# Patient Record
Sex: Female | Born: 1985 | Hispanic: Yes | Marital: Single | Smoking: Never smoker
Health system: Southern US, Community
[De-identification: ages and names within clinical notes are randomized; demographics above are authoritative.]

---

## 2013-12-22 ENCOUNTER — Emergency Department: Payer: Self-pay | Admitting: Emergency Medicine

## 2013-12-22 LAB — URINALYSIS, COMPLETE
BLOOD: NEGATIVE
Bilirubin,UR: NEGATIVE
GLUCOSE, UR: NEGATIVE mg/dL (ref 0–75)
Leukocyte Esterase: NEGATIVE
NITRITE: POSITIVE
Ph: 5 (ref 4.5–8.0)
Protein: 30
Specific Gravity: 1.028 (ref 1.003–1.030)
WBC UR: 8 /HPF (ref 0–5)

## 2013-12-22 LAB — CBC WITH DIFFERENTIAL/PLATELET
Basophil #: 0.1 10*3/uL (ref 0.0–0.1)
Basophil %: 1.2 %
Eosinophil #: 0.1 10*3/uL (ref 0.0–0.7)
Eosinophil %: 0.8 %
HCT: 33.7 % — ABNORMAL LOW (ref 35.0–47.0)
HGB: 10.5 g/dL — AB (ref 12.0–16.0)
Lymphocyte #: 2.7 10*3/uL (ref 1.0–3.6)
Lymphocyte %: 27.6 %
MCH: 20.9 pg — ABNORMAL LOW (ref 26.0–34.0)
MCHC: 31 g/dL — ABNORMAL LOW (ref 32.0–36.0)
MCV: 67 fL — ABNORMAL LOW (ref 80–100)
MONO ABS: 0.5 x10 3/mm (ref 0.2–0.9)
MONOS PCT: 5.5 %
Neutrophil #: 6.4 10*3/uL (ref 1.4–6.5)
Neutrophil %: 64.9 %
Platelet: 267 10*3/uL (ref 150–440)
RBC: 5.01 10*6/uL (ref 3.80–5.20)
RDW: 18.2 % — AB (ref 11.5–14.5)
WBC: 9.8 10*3/uL (ref 3.6–11.0)

## 2013-12-22 LAB — COMPREHENSIVE METABOLIC PANEL
ALBUMIN: 4 g/dL (ref 3.4–5.0)
ALT: 32 U/L (ref 12–78)
ANION GAP: 8 (ref 7–16)
AST: 33 U/L (ref 15–37)
Alkaline Phosphatase: 116 U/L
BUN: 7 mg/dL (ref 7–18)
Bilirubin,Total: 0.6 mg/dL (ref 0.2–1.0)
CALCIUM: 9 mg/dL (ref 8.5–10.1)
Chloride: 105 mmol/L (ref 98–107)
Co2: 21 mmol/L (ref 21–32)
Creatinine: 0.56 mg/dL — ABNORMAL LOW (ref 0.60–1.30)
EGFR (African American): >60
GLUCOSE: 58 mg/dL — AB (ref 65–99)
OSMOLALITY: 264 (ref 275–301)
POTASSIUM: 3.4 mmol/L — AB (ref 3.5–5.1)
Sodium: 134 mmol/L — ABNORMAL LOW (ref 136–145)
Total Protein: 9.3 g/dL — ABNORMAL HIGH (ref 6.4–8.2)

## 2013-12-22 LAB — LIPASE, BLOOD: LIPASE: 209 U/L (ref 73–393)

## 2013-12-23 LAB — HCG, QUANTITATIVE, PREGNANCY: BETA HCG, QUANT.: 146062 m[IU]/mL — AB

## 2013-12-29 ENCOUNTER — Emergency Department: Payer: Self-pay | Admitting: Emergency Medicine

## 2013-12-29 LAB — COMPREHENSIVE METABOLIC PANEL
Albumin: 3.6 g/dL (ref 3.4–5.0)
Alkaline Phosphatase: 109 U/L
Anion Gap: 10 (ref 7–16)
BUN: 7 mg/dL (ref 7–18)
Bilirubin,Total: 0.5 mg/dL (ref 0.2–1.0)
CHLORIDE: 103 mmol/L (ref 98–107)
CO2: 21 mmol/L (ref 21–32)
CREATININE: 0.79 mg/dL (ref 0.60–1.30)
Calcium, Total: 9.1 mg/dL (ref 8.5–10.1)
EGFR (African American): >60
GLUCOSE: 119 mg/dL — AB (ref 65–99)
Osmolality: 267 (ref 275–301)
Potassium: 3.2 mmol/L — ABNORMAL LOW (ref 3.5–5.1)
SGOT(AST): 36 U/L (ref 15–37)
SGPT (ALT): 56 U/L (ref 12–78)
SODIUM: 134 mmol/L — AB (ref 136–145)
Total Protein: 8.4 g/dL — ABNORMAL HIGH (ref 6.4–8.2)

## 2013-12-29 LAB — CBC
HCT: 36.5 % (ref 35.0–47.0)
HGB: 11.4 g/dL — ABNORMAL LOW (ref 12.0–16.0)
MCH: 21.1 pg — ABNORMAL LOW (ref 26.0–34.0)
MCHC: 31.2 g/dL — AB (ref 32.0–36.0)
MCV: 68 fL — ABNORMAL LOW (ref 80–100)
Platelet: 252 10*3/uL (ref 150–440)
RBC: 5.39 10*6/uL — ABNORMAL HIGH (ref 3.80–5.20)
RDW: 19.1 % — AB (ref 11.5–14.5)
WBC: 8.5 10*3/uL (ref 3.6–11.0)

## 2013-12-29 LAB — HCG, QUANTITATIVE, PREGNANCY: BETA HCG, QUANT.: 46292 m[IU]/mL — AB

## 2013-12-30 LAB — URINALYSIS, COMPLETE
Blood: NEGATIVE
Glucose,UR: 50 mg/dL (ref 0–75)
Nitrite: NEGATIVE
PH: 5 (ref 4.5–8.0)
Protein: 100
RBC,UR: 6 /HPF (ref 0–5)
Specific Gravity: 1.027 (ref 1.003–1.030)
Squamous Epithelial: 10

## 2020-02-26 ENCOUNTER — Ambulatory Visit: Payer: Self-pay | Admitting: Internal Medicine

## 2021-02-20 ENCOUNTER — Emergency Department: Payer: Self-pay

## 2021-02-20 ENCOUNTER — Emergency Department
Admission: EM | Admit: 2021-02-20 | Discharge: 2021-02-20 | Disposition: A | Discharge status: Home / Self Care | Payer: Self-pay | Attending: Emergency Medicine | Admitting: Emergency Medicine

## 2021-02-20 ENCOUNTER — Other Ambulatory Visit: Payer: Self-pay

## 2021-02-20 DIAGNOSIS — R109 Unspecified abdominal pain: Secondary | ICD-10-CM

## 2021-02-20 DIAGNOSIS — K8020 Calculus of gallbladder without cholecystitis without obstruction: Secondary | ICD-10-CM | POA: Insufficient documentation

## 2021-02-20 DIAGNOSIS — D509 Iron deficiency anemia, unspecified: Secondary | ICD-10-CM

## 2021-02-20 DIAGNOSIS — K529 Noninfective gastroenteritis and colitis, unspecified: Secondary | ICD-10-CM | POA: Insufficient documentation

## 2021-02-20 DIAGNOSIS — D649 Anemia, unspecified: Secondary | ICD-10-CM | POA: Insufficient documentation

## 2021-02-20 DIAGNOSIS — N390 Urinary tract infection, site not specified: Secondary | ICD-10-CM | POA: Insufficient documentation

## 2021-02-20 LAB — POC URINE PREG, ED: Preg Test, Ur: NEGATIVE

## 2021-02-20 LAB — COMPREHENSIVE METABOLIC PANEL
ALT: 39 U/L (ref 0–44)
AST: 29 U/L (ref 15–41)
Albumin: 4 g/dL (ref 3.5–5.0)
Alkaline Phosphatase: 71 U/L (ref 38–126)
Anion gap: 8 (ref 5–15)
BUN: 10 mg/dL (ref 6–20)
CO2: 23 mmol/L (ref 22–32)
Calcium: 8.7 mg/dL — ABNORMAL LOW (ref 8.9–10.3)
Chloride: 107 mmol/L (ref 98–111)
Creatinine, Ser: 0.57 mg/dL (ref 0.44–1.00)
GFR, Estimated: >60 mL/min (ref >60)
Glucose, Bld: 89 mg/dL (ref 70–99)
Potassium: 3.4 mmol/L — ABNORMAL LOW (ref 3.5–5.1)
Sodium: 138 mmol/L (ref 135–145)
Total Bilirubin: 0.9 mg/dL (ref 0.3–1.2)
Total Protein: 8.2 g/dL — ABNORMAL HIGH (ref 6.5–8.1)

## 2021-02-20 LAB — URINALYSIS, COMPLETE (UACMP) WITH MICROSCOPIC
Bilirubin Urine: NEGATIVE
Glucose, UA: NEGATIVE mg/dL
Hgb urine dipstick: NEGATIVE
Ketones, ur: 5 mg/dL — AB
Leukocytes,Ua: NEGATIVE
Nitrite: POSITIVE — AB
Protein, ur: NEGATIVE mg/dL
Specific Gravity, Urine: 1.026 (ref 1.005–1.030)
pH: 5 (ref 5.0–8.0)

## 2021-02-20 LAB — CBC
HCT: 28.9 % — ABNORMAL LOW (ref 36.0–46.0)
Hemoglobin: 7.8 g/dL — ABNORMAL LOW (ref 12.0–15.0)
MCH: 16.8 pg — ABNORMAL LOW (ref 26.0–34.0)
MCHC: 27 g/dL — ABNORMAL LOW (ref 30.0–36.0)
MCV: 62.2 fL — ABNORMAL LOW (ref 80.0–100.0)
Platelets: 222 10*3/uL (ref 150–400)
RBC: 4.65 MIL/uL (ref 3.87–5.11)
RDW: 19.9 % — ABNORMAL HIGH (ref 11.5–15.5)
WBC: 5.1 10*3/uL (ref 4.0–10.5)
nRBC: 0 % (ref 0.0–0.2)

## 2021-02-20 LAB — IRON AND TIBC
Iron: 12 ug/dL — ABNORMAL LOW (ref 28–170)
Saturation Ratios: 3 % — ABNORMAL LOW (ref 10.4–31.8)
TIBC: 489 ug/dL — ABNORMAL HIGH (ref 250–450)
UIBC: 477 ug/dL

## 2021-02-20 LAB — LIPASE, BLOOD: Lipase: 26 U/L (ref 11–51)

## 2021-02-20 LAB — HCG, QUANTITATIVE, PREGNANCY: hCG, Beta Chain, Quant, S: <1 m[IU]/mL (ref <5)

## 2021-02-20 MED ORDER — ONDANSETRON 4 MG PO TBDP: 4.0000 mg | ORAL_TABLET | Freq: Four times a day (QID) | ORAL | 0 refills | Status: AC | PRN

## 2021-02-20 MED ORDER — FERROUS SULFATE 325 (65 FE) MG PO TABS: 325.0000 mg | ORAL_TABLET | Freq: Every day | ORAL | 3 refills | Status: AC

## 2021-02-20 MED ORDER — AMOXICILLIN-POT CLAVULANATE 875-125 MG PO TABS: 1.0000 | ORAL_TABLET | Freq: Two times a day (BID) | ORAL | 0 refills | Status: AC

## 2021-02-20 NOTE — ED Provider Notes (Signed)
[facility] Emergency Department Provider Note   ____________________________________________   Event Date/Time   First MD Initiated Contact with Patient 02/20/21 1513     (approximate)  I have reviewed the triage vital signs and the nursing notes.   HISTORY  Chief Complaint Abdominal Pain, Nausea, and Diarrhea  Spanish interpreter utilized Shelby Gamble)  HPI Shelby Gamble is a 35 y.o. female with a history of prior cesarean section about 6 years ago as well as anemia  Patient reports she has had 2 days of pain moderate in her upper abdomen.  No chest pain or shortness of breath.  Denies pregnancy.  The pain is located right in the middle of her upper abdomen.  Does not radiate.  No fevers or chills.  No nausea or vomiting.  She has had some loose stools that are not black or bloody   Patient reports she used to be on iron tablets but moved here from [place] and since then has not been on them.  She is aware that she has anemia has not been able to be on iron.  She denies any bloody stools bleeding or other issue.  Reports previous cesarean section about 6 years ago   History reviewed. No pertinent past medical history.  There are no problems to display for this patient.   History reviewed. No pertinent surgical history.  Prior to Admission medications   Medication Sig Start Date End Date Taking? Authorizing Provider  amoxicillin-clavulanate (AUGMENTIN) 875-125 MG tablet Take 1 tablet by mouth 2 (two) times daily. 02/20/21  Yes Sharyn Creamer, MD  ondansetron (ZOFRAN ODT) 4 MG disintegrating tablet Take 1 tablet (4 mg total) by mouth every 6 (six) hours as needed for nausea or vomiting. 02/20/21  Yes Sharyn Creamer, MD  ferrous sulfate 325 (65 FE) MG tablet Take 1 tablet (325 mg total) by mouth daily. 02/20/21 06/20/21 Yes Sharyn Creamer, MD    Allergies Patient has no allergy information on record.  History reviewed. No pertinent family history.  Social  History Social History   Tobacco Use  . Smoking status: Never Smoker  . Smokeless tobacco: Never Used  Substance Use Topics  . Alcohol use: Not Currently  . Drug use: Not Currently    Review of Systems Constitutional: No fever/chills Eyes: No visual changes. Cardiovascular: Denies chest pain. Respiratory: Denies shortness of breath. Gastrointestinal: See HPI.  Denies lower abdominal pain. Genitourinary: Negative for dysuria.  No vaginal discharge or bleeding. Musculoskeletal: Negative for back pain. Skin: Negative for rash. Neurological: Negative for headaches, areas of focal weakness or numbness.    ____________________________________________   PHYSICAL EXAM:  VITAL SIGNS: ED Triage Vitals  Enc Vitals Group     BP 02/20/21 1523 131/87     Pulse Rate 02/20/21 1523 82     Resp 02/20/21 1523 16     Temp 02/20/21 1523 98.7 F (37.1 C)     Temp Source 02/20/21 1523 Oral     SpO2 02/20/21 1523 100 %     Weight 02/20/21 1523 134 lb 14.7 oz (61.2 kg)     Height 02/20/21 1457 5\' 2"  (1.575 m)     Head Circumference --      Peak Flow --      Pain Score 02/20/21 1457 4     Pain Loc --      Pain Edu? --      Excl. in GC? --     Constitutional: Alert and oriented. Well appearing and in no  acute distress.  Sitting up without distress. Eyes: Conjunctivae are normal. Head: Atraumatic. Nose: No congestion/rhinnorhea. Mouth/Throat: Mucous membranes are moist. Neck: No stridor.  Cardiovascular: Normal rate, regular rhythm. Grossly normal heart sounds.  Good peripheral circulation. Respiratory: Normal respiratory effort.  No retractions. Lungs CTAB. Gastrointestinal: Soft and reports moderate tenderness focally in the epigastrium and also slightly in the right upper quadrant as well.  Eulah Pont sign is equivocal.  She does also report very mild tenderness to the lower quadrants bilaterally, but reports the majority of her pain is really in the mid upper abdomen located to the  epigastrium.  No rebound guarding or distention. No distention. Musculoskeletal: No lower extremity tenderness nor edema. Neurologic:  Normal speech and language. No gross focal neurologic deficits are appreciated.  Skin:  Skin is warm, dry and intact. No rash noted. Psychiatric: Mood and affect are normal. Speech and behavior are normal.  ____________________________________________   LABS (all labs ordered are listed, but only abnormal results are displayed)  Labs Reviewed  COMPREHENSIVE METABOLIC PANEL - Abnormal; Notable for the following components:      Result Value   Potassium 3.4 (*)    Calcium 8.7 (*)    Total Protein 8.2 (*)    All other components within normal limits  CBC - Abnormal; Notable for the following components:   Hemoglobin 7.8 (*)    HCT 28.9 (*)    MCV 62.2 (*)    MCH 16.8 (*)    MCHC 27.0 (*)    RDW 19.9 (*)    All other components within normal limits  URINALYSIS, COMPLETE (UACMP) WITH MICROSCOPIC - Abnormal; Notable for the following components:   Color, Urine AMBER (*)    APPearance HAZY (*)    Ketones, ur 5 (*)    Nitrite POSITIVE (*)    Bacteria, UA MANY (*)    All other components within normal limits  IRON AND TIBC - Abnormal; Notable for the following components:   Iron 12 (*)    TIBC 489 (*)    Saturation Ratios 3 (*)    All other components within normal limits  LIPASE, BLOOD  HCG, QUANTITATIVE, PREGNANCY  POC URINE PREG, ED   ____________________________________________  EKG  Reviewed inter by me at 1330 Heart rate 80 QRS 80 QTc 430 Normal sinus rhythm, left ventricular hypertrophy.  No evidence of acute ischemia ____________________________________________  RADIOLOGY  CT ABDOMEN PELVIS WO CONTRAST  Result Date: 02/20/2021 CLINICAL DATA:  Epigastric pain EXAM: CT ABDOMEN AND PELVIS WITHOUT CONTRAST TECHNIQUE: Multidetector CT imaging of the abdomen and pelvis was performed following the standard protocol without IV contrast.  COMPARISON:  Ultrasound 02/20/2021, 12/30/2013 FINDINGS: Lower chest: Lung bases are clear. Normal heart size. No pericardial effusion. Hepatobiliary: Diffuse hepatic hypoattenuation compatible with hepatic steatosis, sparing gallbladder fossa. Gallbladder contains partially calcified gallstones. No pericholecystic fluid or inflammation. No significant gallbladder distension. No biliary ductal dilatation or visible intraductal gallstones. Pancreas: No pancreatic ductal dilatation or surrounding inflammatory changes. Spleen: Normal in size. No concerning splenic lesions. Adrenals/Urinary Tract: Normal adrenals. Kidneys are symmetric in size and normally located. No visible or contour deforming renal lesion is seen. No urolithiasis or hydronephrosis. Stomach/Bowel: Distal esophagus, stomach and duodenal sweep are unremarkable. Much of the large and small bowel appears fluid-filled but without significant bowel wall thickening or dilatation. No evidence of obstruction. Partially air-filled appendix without focal periappendiceal inflammation is seen in the right lower quadrant. Vascular/Lymphatic: No significant vascular findings are present. Numerous though nonenlarged nodes are  seen in the mesentery, possibly reactive. No enlarged abdominal or pelvic lymph nodes. Reproductive: Slightly retroflexed uterus without other gross abnormality. No concerning adnexal masses or lesions. Other: Small volume of low-attenuation free fluid in the deep pelvis, nonspecific in a reproductive age female. No free intraperitoneal air. No bowel containing hernias. Musculoskeletal: No acute osseous abnormality or suspicious osseous lesion. IMPRESSION: 1. Diffusely fluid-filled appearance of the small bowel and much of the colon with some possibly reactive nodes in the mesentery, nonspecific findings though could reflect a mild enterocolitis in the clinical setting. 2. Small volume low-attenuation free fluid in the deep pelvis, nonspecific  though often physiologic in a reproductive age female. 3. Cholelithiasis. No evidence of acute cholecystitis or biliary dilatation. 4. Hepatic steatosis Electronically Signed   By: Kreg ShropshirePrice  DeHay M.D.   On: 02/20/2021 18:05   US ABDOMEN LIMITED RUQ (LIVER/GB)  Result Date: 02/20/2021 CLINICAL DATA:  35 year old female with nausea vomiting abdominal pain. EXAM: ULTRASOUND ABDOMEN LIMITED RIGHT UPPER QUADRANT COMPARISON:  Right upper quadrant ultrasound dated 12/30/2013. FINDINGS: Gallbladder: There is gallstone and small amount of sludge within the gallbladder. No gallbladder wall thickening or pericholecystic fluid. Negative sonographic Murphy's sign. Common bile duct: Diameter: 3 mm Liver: There is diffuse increased liver echogenicity most commonly seen in the setting of fatty infiltration. Superimposed inflammation or fibrosis is not excluded. Clinical correlation is recommended. Portal vein is patent on color Doppler imaging with normal direction of blood flow towards the liver. Other: None. IMPRESSION: 1. Cholelithiasis without sonographic evidence of acute cholecystitis. 2. Fatty liver. Electronically Signed   By: Elgie CollardArash  Radparvar M.D.   On: 02/20/2021 16:43    Imaging reviewed ultrasound positive for cholelithiasis no evidence cholecystitis  CT imaging reviewed indicative of likely enterocolitis, cholelithiasis without evidence cholecystitis ____________________________________________   PROCEDURES  Procedure(s) performed: None  Procedures  Critical Care performed: No  ____________________________________________   INITIAL IMPRESSION / ASSESSMENT AND PLAN / ED COURSE  Pertinent labs & imaging results that were available during my care of the patient were reviewed by me and considered in my medical decision making (see chart for details).   Differential diagnosis includes but is not limited to, abdominal perforation, aortic dissection, cholecystitis, appendicitis, diverticulitis,  colitis, pud, esophagitis/gastritis, kidney stone, pyelonephritis, urinary tract infection, aortic aneurysm. All are considered in decision and treatment plan. Based upon the patient's presentation and risk factors, we will proceed first by obtaining ultrasound of the right upper quadrant epigastric region pancreatic region etc.  If this fails to show an acute cause will consider CT abdomen pelvis.  Discussed this with the patient she is in agreement  Patient is very nontoxic well-appearing afebrile without white count.  Reassuring exam without acute abdomen  Also of note, patient's labs indicate significant anemia, appears to be microcytic in nature.  Review of previous labs from years ago does indicate she had elements of anemia as well at that time, but not as severe.  She does not appear to have any symptomatic anemia.    ----------------------------------------- 7:47 PM on 02/20/2021 -----------------------------------------  Reviewed labs, imaging to this point.  There was some concern about potentially urinary tract infection, also possible mild colitis.  Placed the patient on Augmentin for treatment of UTI, Zofran, iron tablets which she is previously been on.  Discussed careful return precautions with her and her son.  She is understanding and in agreement.  Also made recommendations for follow-up she reports not having any insurance at this time and did give recommendations to follow-up  with [facility]  I did reinforce with her I think she will need some longstanding primary care and specialty follow-up on her anemia possibly gallstones  No evidence of acute cholecystitis or symptomatic gallstone at this point.  Will discharge, discussed careful return precautions around her current diagnoses as well as signs and symptoms to watch for should she ever have issues with her gallstone  Return precautions and treatment recommendations and follow-up discussed  with the patient who is agreeable with the plan.   ____________________________________________   FINAL CLINICAL IMPRESSION(S) / ED DIAGNOSES  Final diagnoses:  Abdominal pain  Iron deficiency anemia, unspecified iron deficiency anemia type  Urinary tract infection, acute  Colitis  Calculus of gallbladder without cholecystitis without obstruction        Note:  This document was prepared using Dragon voice recognition software and may include unintentional dictation errors       Sharyn Creamer, MD 02/20/21 1948

## 2021-02-20 NOTE — ED Notes (Signed)
Dr. Fanny Bien at bedside with interpreter

## 2021-02-20 NOTE — ED Triage Notes (Addendum)
Pt arrives to ed via pov with c/o abd pain and n/v/d x 2 days. Interpreter used during triage. Pt reports pain in epigastric region w/o radiation.  denies any chest pain. Denies any blood in stool or emesis.  NAD noted at this time.

## 2022-10-27 IMAGING — US US ABDOMEN LIMITED
1 series · 15 of 25 positions shown · non-contrast
Comparison: Right upper quadrant ultrasound dated 12/30/2013.

CLINICAL DATA: 33-year-old female with nausea vomiting abdominal
pain.

EXAM:
ULTRASOUND ABDOMEN LIMITED RIGHT UPPER QUADRANT

[Series 1: us abdomen limited ruq · 15 of 51 slices shown]
[im 1/51]
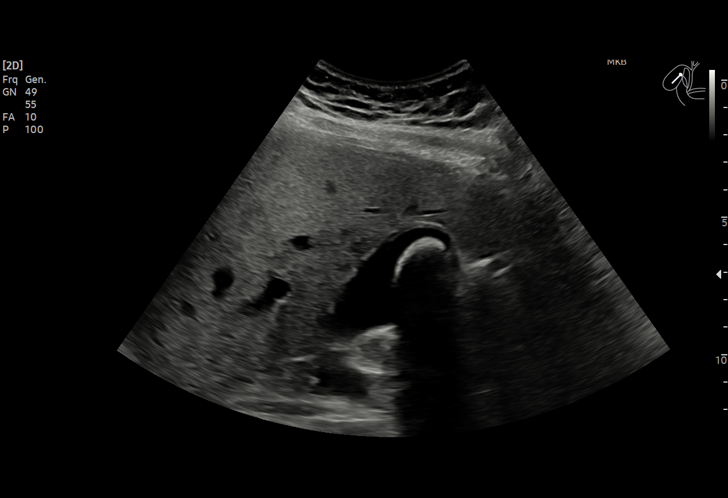
[im 5/51]
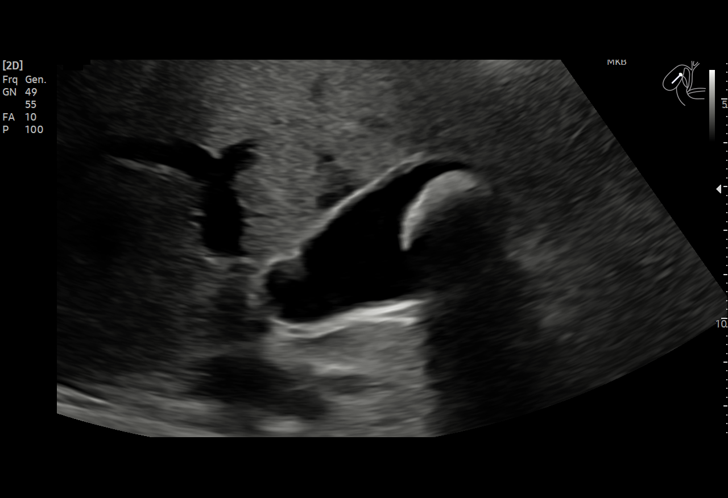
[im 9/51]
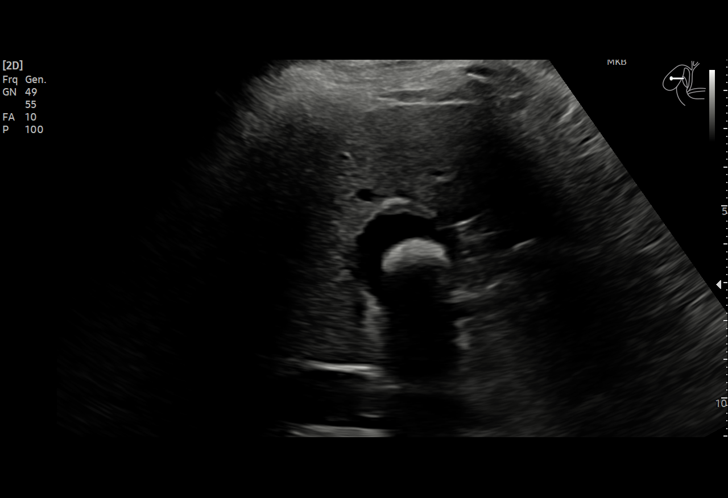
[im 11/51]
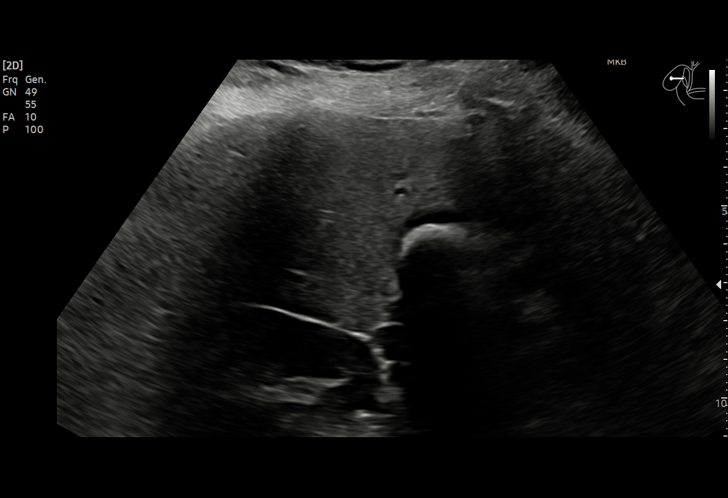
[im 15/51]
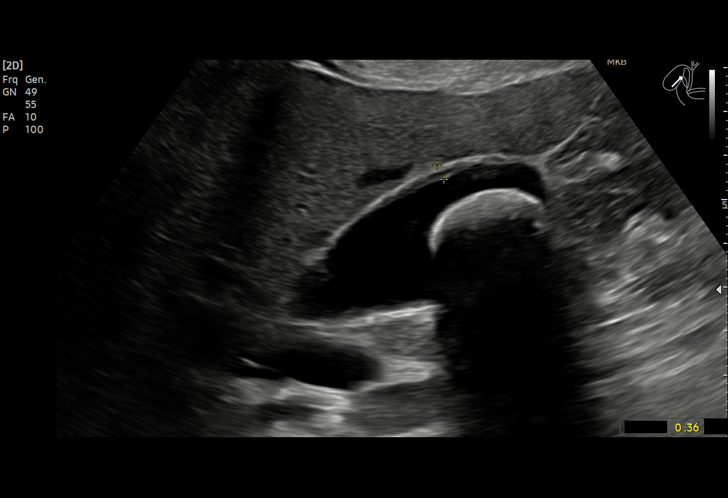
[im 19/51]
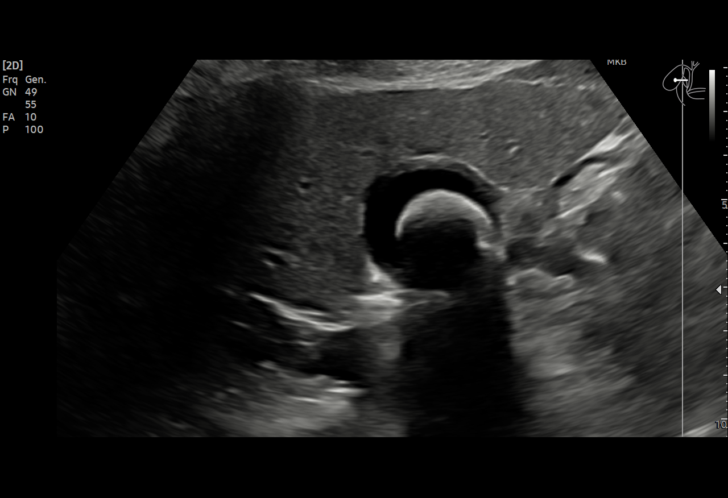
[im 21/51]
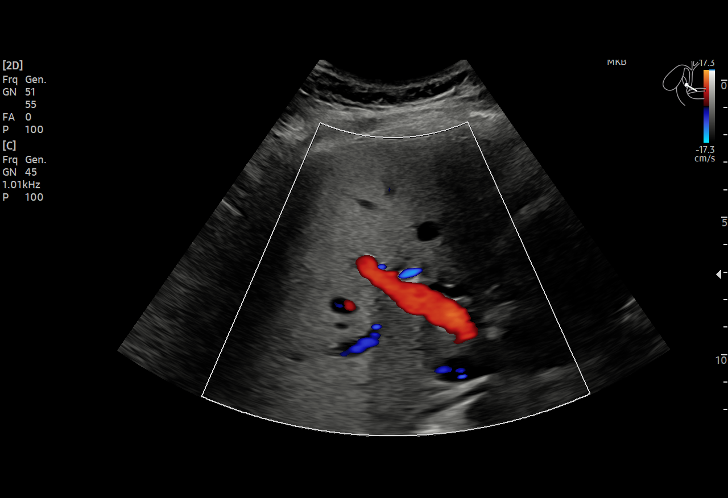
[im 26/51]
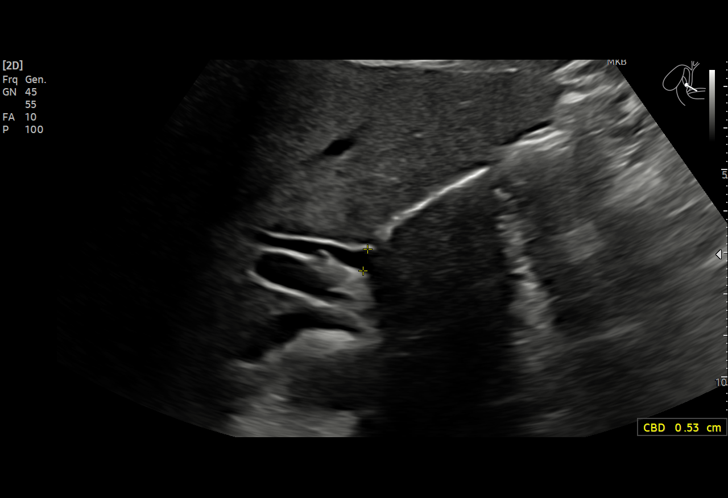
[im 30/51]
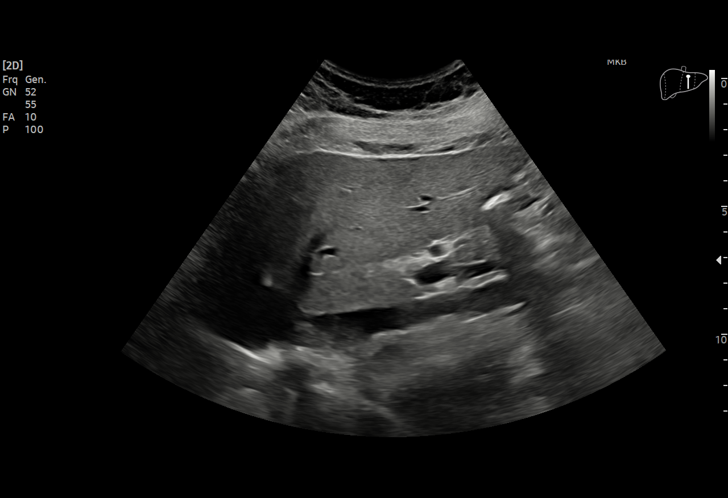
[im 32/51]
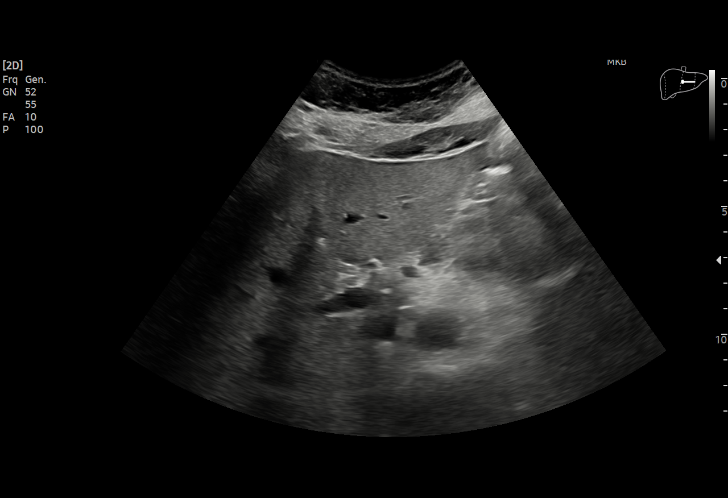
[im 36/51]
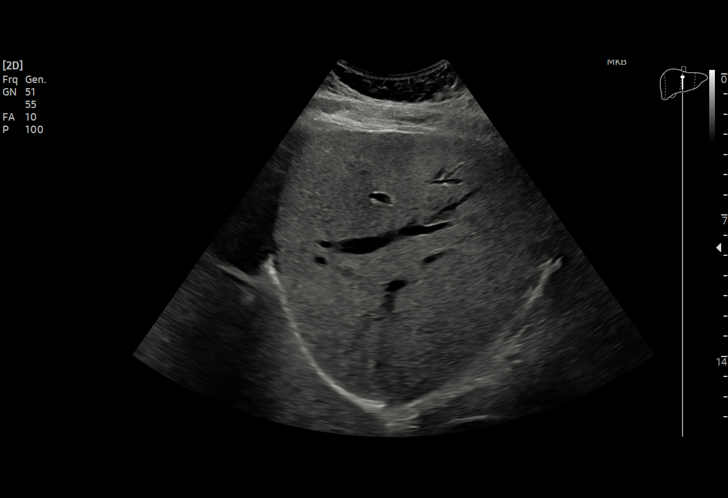
[im 40/51]
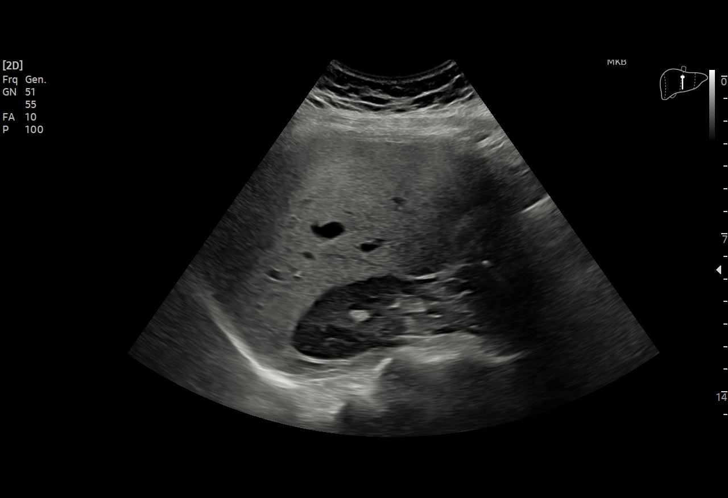
[im 42/51]
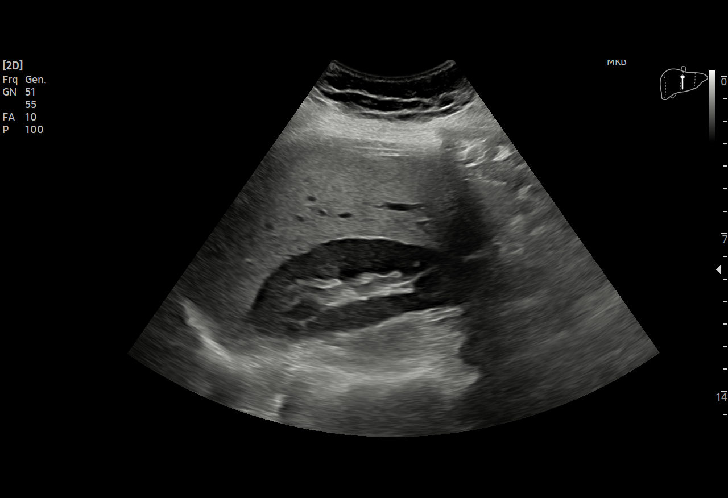
[im 46/51]
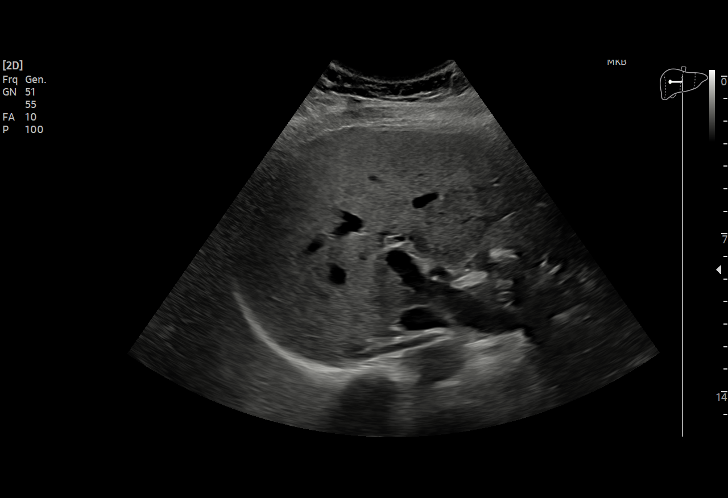
[im 51/51]
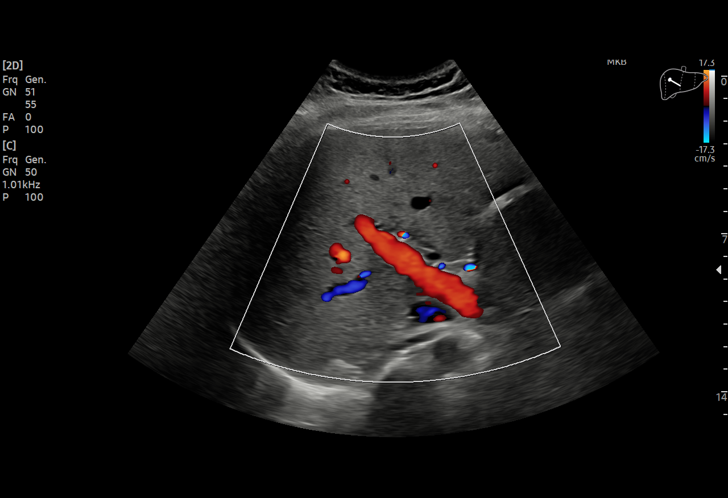

[15 of 25 positions shown; findings below may reference images not displayed]

FINDINGS: Gallbladder:

There is gallstone and small amount of sludge within the
gallbladder. No gallbladder wall thickening or pericholecystic
fluid. Negative sonographic Murphy's sign.

Common bile duct:

Diameter: 3 mm

Liver:

There is diffuse increased liver echogenicity most commonly seen in
the setting of fatty infiltration. Superimposed inflammation or
fibrosis is not excluded. Clinical correlation is recommended.
Portal vein is patent on color Doppler imaging with normal direction
of blood flow towards the liver.

Other: None.
IMPRESSION: 1. Cholelithiasis without sonographic evidence of acute
cholecystitis.
2. Fatty liver.

## 2022-10-27 IMAGING — CT CT ABD-PELV W/O CM
2 of 4 series · 16 of 46 positions shown, 18 images · non-contrast
Comparison: Ultrasound 02/20/2021, 12/30/2013

CLINICAL DATA: Epigastric pain

EXAM:
CT ABDOMEN AND PELVIS WITHOUT CONTRAST
TECHNIQUE: Multidetector CT imaging of the abdomen and pelvis was performed
following the standard protocol without IV contrast.

[Series 2: routine abd/pel wo · axial · 0.76mm/px · z∈[-799,-394]mm · 13 of 89 slices shown, 15 images]
[im 4/89  soft-tissue]
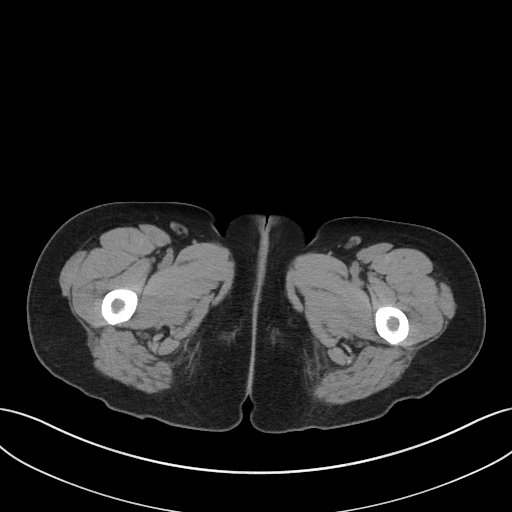
[im 4/89  bone]
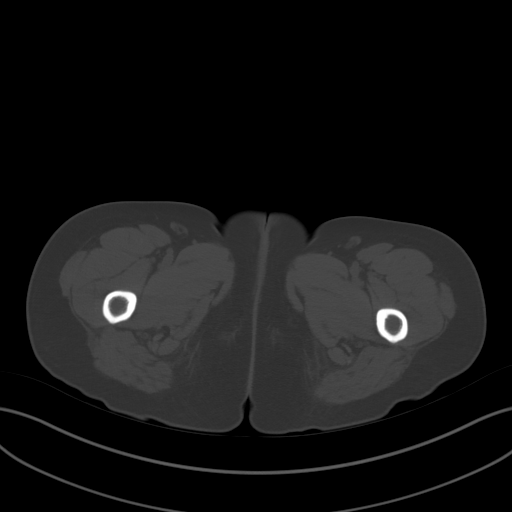
[im 11/89  soft-tissue]
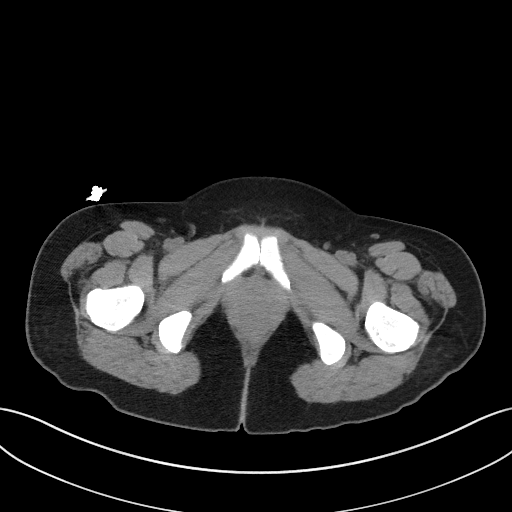
[im 18/89  soft-tissue]
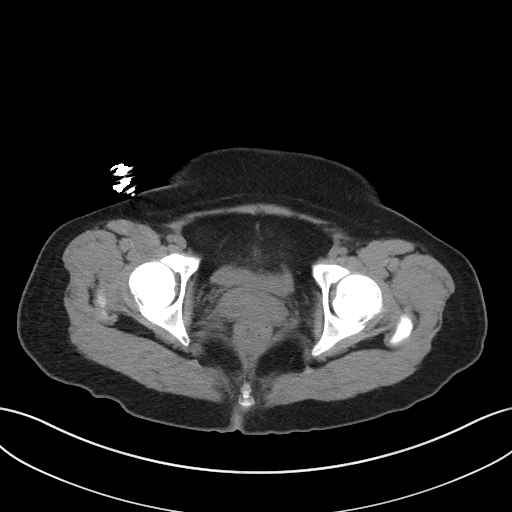
[im 25/89  soft-tissue]
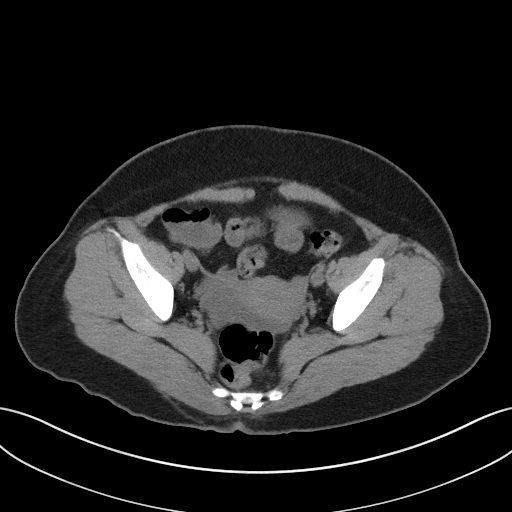
[im 32/89  soft-tissue]
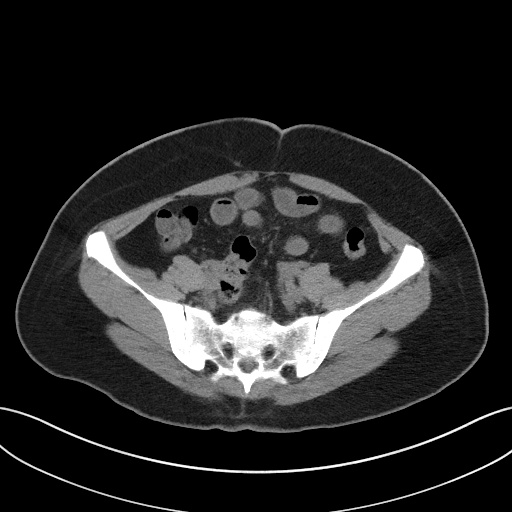
[im 39/89  soft-tissue]
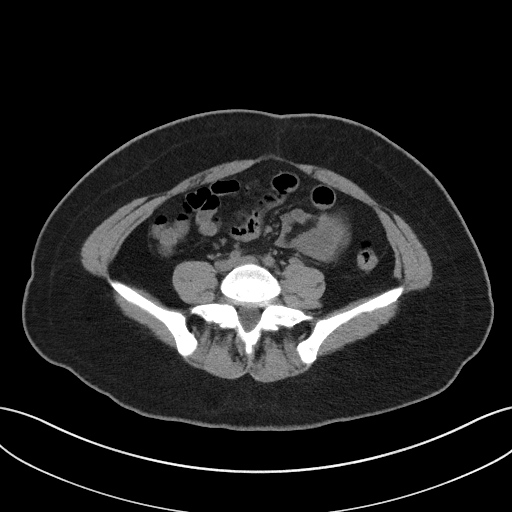
[im 46/89  soft-tissue]
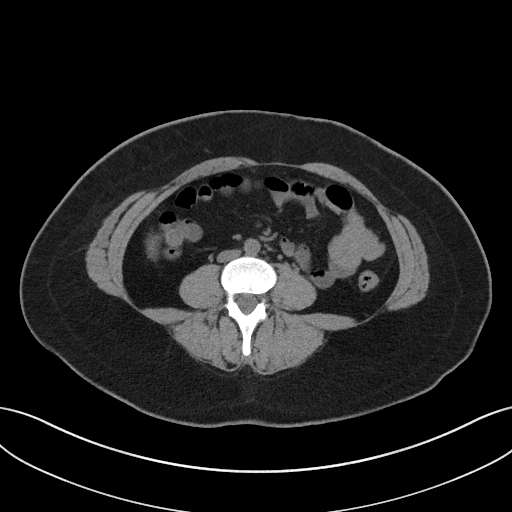
[im 50/89  soft-tissue]
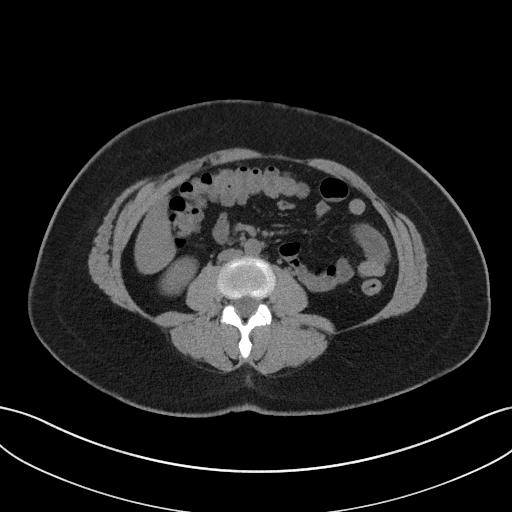
[im 57/89  soft-tissue]
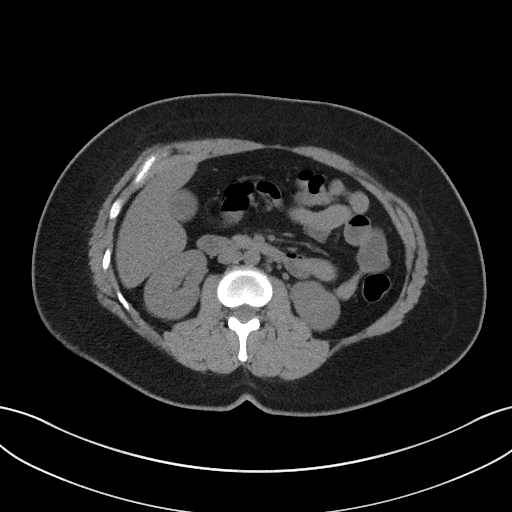
[im 57/89  bone]
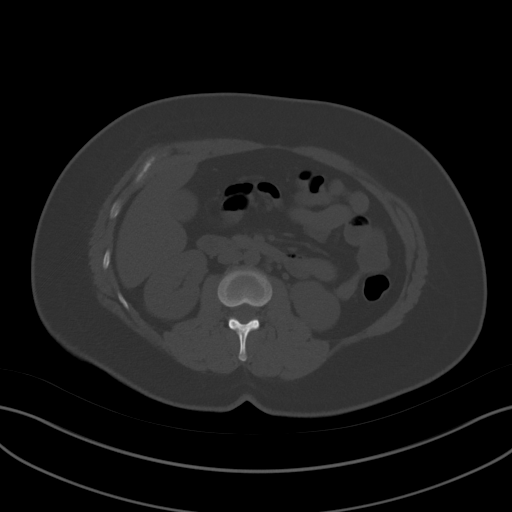
[im 64/89  soft-tissue]
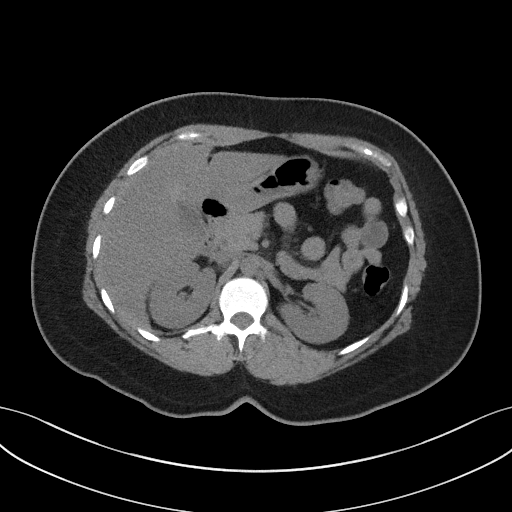
[im 71/89  soft-tissue]
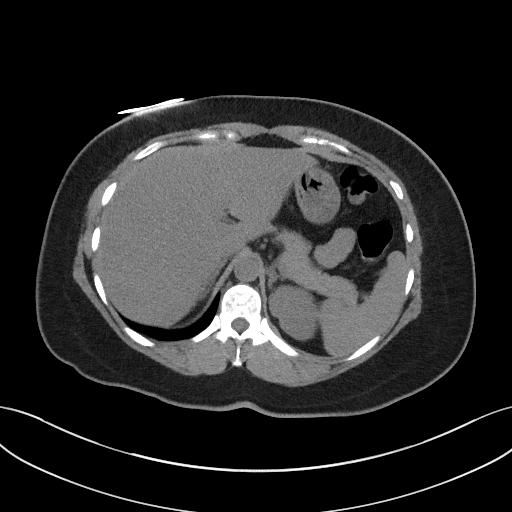
[im 78/89  soft-tissue]
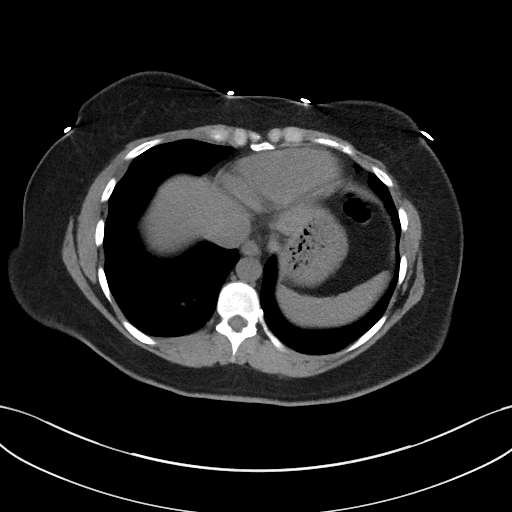
[im 85/89  soft-tissue]
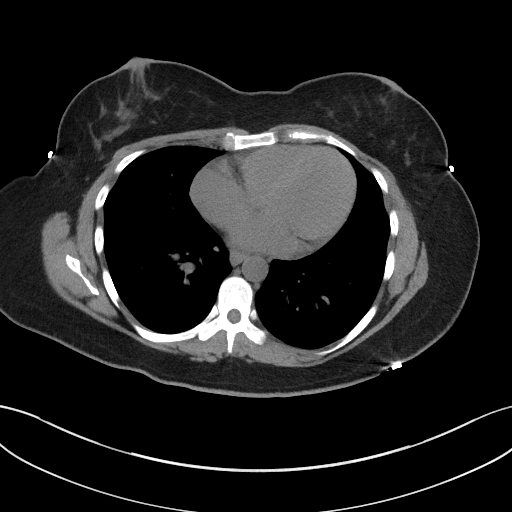

[Series 5: coronal st · coronal · 0.78mm/px · 3 of 85 slices shown]
[im 29/85  soft-tissue]
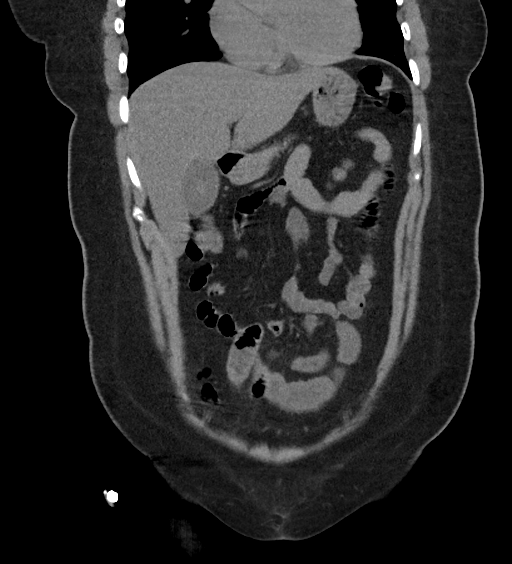
[im 38/85  soft-tissue]
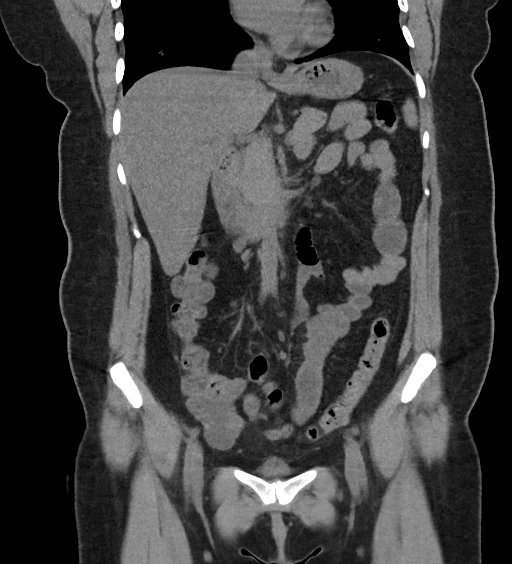
[im 47/85  soft-tissue]
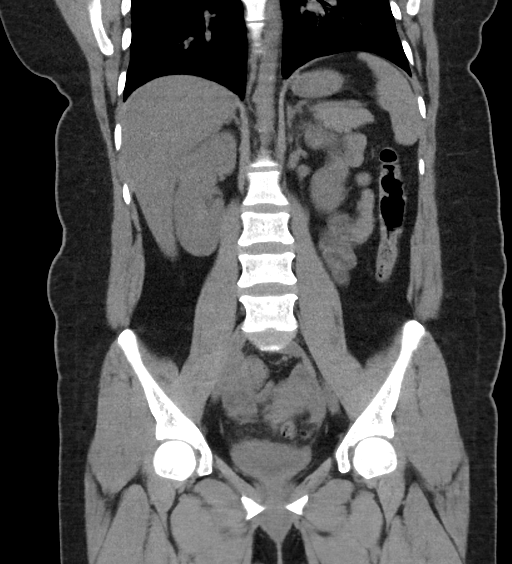

[16 of 46 positions shown; findings below may reference images not displayed]

FINDINGS: Lower chest: Lung bases are clear. Normal heart size. No pericardial
effusion.

Hepatobiliary: Diffuse hepatic hypoattenuation compatible with
hepatic steatosis, sparing gallbladder fossa. Gallbladder contains
partially calcified gallstones. No pericholecystic fluid or
inflammation. No significant gallbladder distension. No biliary
ductal dilatation or visible intraductal gallstones.

Pancreas: No pancreatic ductal dilatation or surrounding
inflammatory changes.

Spleen: Normal in size. No concerning splenic lesions.

Adrenals/Urinary Tract: Normal adrenals. Kidneys are symmetric in
size and normally located. No visible or contour deforming renal
lesion is seen. No urolithiasis or hydronephrosis.

Stomach/Bowel: Distal esophagus, stomach and duodenal sweep are
unremarkable. Much of the large and small bowel appears fluid-filled
but without significant bowel wall thickening or dilatation. No
evidence of obstruction. Partially air-filled appendix without focal
periappendiceal inflammation is seen in the right lower quadrant.

Vascular/Lymphatic: No significant vascular findings are present.
Numerous though nonenlarged nodes are seen in the mesentery,
possibly reactive. No enlarged abdominal or pelvic lymph nodes.

Reproductive: Slightly retroflexed uterus without other gross
abnormality. No concerning adnexal masses or lesions.

Other: Small volume of low-attenuation free fluid in the deep
pelvis, nonspecific in a reproductive age female. No free
intraperitoneal air. No bowel containing hernias.

Musculoskeletal: No acute osseous abnormality or suspicious osseous
lesion.
IMPRESSION: 1. Diffusely fluid-filled appearance of the small bowel and much of
the colon with some possibly reactive nodes in the mesentery,
nonspecific findings though could reflect a mild enterocolitis in
the clinical setting.
2. Small volume low-attenuation free fluid in the deep pelvis,
nonspecific though often physiologic in a reproductive age female.
3. Cholelithiasis. No evidence of acute cholecystitis or biliary
dilatation.
4. Hepatic steatosis

## 2024-09-06 ENCOUNTER — Emergency Department
Admission: EM | Admit: 2024-09-06 | Discharge: 2024-09-06 | Disposition: A | Discharge status: Home / Self Care | Payer: Self-pay | Attending: Emergency Medicine | Admitting: Emergency Medicine

## 2024-09-06 ENCOUNTER — Other Ambulatory Visit: Payer: Self-pay

## 2024-09-06 ENCOUNTER — Encounter: Payer: Self-pay | Admitting: Emergency Medicine

## 2024-09-06 DIAGNOSIS — D509 Iron deficiency anemia, unspecified: Secondary | ICD-10-CM | POA: Insufficient documentation

## 2024-09-06 DIAGNOSIS — D649 Anemia, unspecified: Secondary | ICD-10-CM

## 2024-09-06 LAB — PREPARE RBC (CROSSMATCH)

## 2024-09-06 LAB — CBC WITH DIFFERENTIAL/PLATELET
Abs Immature Granulocytes: 0.01 K/uL (ref 0.00–0.07)
Basophils Absolute: 0 K/uL (ref 0.0–0.1)
Basophils Relative: 0 %
Eosinophils Absolute: 0.1 K/uL (ref 0.0–0.5)
Eosinophils Relative: 2 %
HCT: 26.1 % — ABNORMAL LOW (ref 36.0–46.0)
Hemoglobin: 6.5 g/dL — ABNORMAL LOW (ref 12.0–15.0)
Immature Granulocytes: 0 %
Lymphocytes Relative: 32 %
Lymphs Abs: 1.6 K/uL (ref 0.7–4.0)
MCH: 15.1 pg — ABNORMAL LOW (ref 26.0–34.0)
MCHC: 24.9 g/dL — ABNORMAL LOW (ref 30.0–36.0)
MCV: 60.6 fL — ABNORMAL LOW (ref 80.0–100.0)
Monocytes Absolute: 0.3 K/uL (ref 0.1–1.0)
Monocytes Relative: 7 %
Neutro Abs: 3 K/uL (ref 1.7–7.7)
Neutrophils Relative %: 59 %
Platelets: 208 K/uL (ref 150–400)
RBC: 4.31 MIL/uL (ref 3.87–5.11)
RDW: 20.1 % — ABNORMAL HIGH (ref 11.5–15.5)
Smear Review: NORMAL
WBC: 5 K/uL (ref 4.0–10.5)
nRBC: 0 % (ref 0.0–0.2)

## 2024-09-06 LAB — BASIC METABOLIC PANEL WITH GFR
Anion gap: 11 (ref 5–15)
BUN: 12 mg/dL (ref 6–20)
CO2: 22 mmol/L (ref 22–32)
Calcium: 9.5 mg/dL (ref 8.9–10.3)
Chloride: 106 mmol/L (ref 98–111)
Creatinine, Ser: 0.56 mg/dL (ref 0.44–1.00)
GFR, Estimated: >60 mL/min (ref >60)
Glucose, Bld: 88 mg/dL (ref 70–99)
Potassium: 3.8 mmol/L (ref 3.5–5.1)
Sodium: 139 mmol/L (ref 135–145)

## 2024-09-06 LAB — ABO/RH: ABO/RH(D): O POS

## 2024-09-06 MED ORDER — SODIUM CHLORIDE 0.9 % IV SOLN: 10.0000 mL/h | Freq: Once | INTRAVENOUS | Status: DC

## 2024-09-06 NOTE — ED Triage Notes (Signed)
 Pt to ED via POV. Pt states that her doctor sent her over due to low Hgb. Pt states that she was told she needed a blood transfusion.

## 2024-09-06 NOTE — ED Provider Notes (Signed)
 [facility] Provider Note   Event Date/Time   First MD Initiated Contact with Patient 09/06/24 1548     (approximate) History  abnormal labs  HPI Shelby Gamble is a 38 y.o. female with a stated past medical history of iron deficiency anemia not on iron supplementation who presents after seeing her hematologist for the first time in years yesterday being told that she was anemic at 6.5.  Patient states that she is having some symptoms including dyspnea on exertion and generalized weakness however denies any chest pain.  Patient states that the symptoms are similar to symptoms she has had in the past due to anemia.  Patient states she is currently being worked up to identify why she is iron deficient patient denies any abnormal vaginal bleeding, hematemesis. ROS: Patient currently denies any vision changes, tinnitus, difficulty speaking, facial droop, sore throat, chest pain, shortness of breath, abdominal pain, nausea/vomiting/diarrhea, dysuria, or weakness/numbness/paresthesias in any extremity   Physical Exam  Triage Vital Signs: ED Triage Vitals [09/06/24 1338]  Encounter Vitals Group     BP (!) 154/83     Girls Systolic BP Percentile      Girls Diastolic BP Percentile      Boys Systolic BP Percentile      Boys Diastolic BP Percentile      Pulse Rate 88     Resp 16     Temp 99.2 F (37.3 C)     Temp Source Oral     SpO2 100 %     Weight 128 lb (58.1 kg)     Height 5' 4 (1.626 m)     Head Circumference      Peak Flow      Pain Score 0     Pain Loc      Pain Education      Exclude from Growth Chart    Most recent vital signs: Vitals:   09/06/24 2135 09/06/24 2206  BP:  (!) 143/85  Pulse: 79 86  Resp:  16  Temp:  98.1 F (36.7 C)  SpO2: 100% 100%   General: Awake, oriented x4. CV:  Good peripheral perfusion. Resp:  Normal effort. Abd:  No distention. Other:  Middle-aged well-developed, well-nourished Hispanic female resting comfortably in no  acute distress ED Results / Procedures / Treatments  Labs (all labs ordered are listed, but only abnormal results are displayed) Labs Reviewed  CBC WITH DIFFERENTIAL/PLATELET - Abnormal; Notable for the following components:      Result Value   Hemoglobin 6.5 (*)    HCT 26.1 (*)    MCV 60.6 (*)    MCH 15.1 (*)    MCHC 24.9 (*)    RDW 20.1 (*)    All other components within normal limits  BASIC METABOLIC PANEL WITH GFR  TYPE AND SCREEN  PREPARE RBC (CROSSMATCH)  ABO/RH   PROCEDURES: Critical Care performed: No Procedures MEDICATIONS ORDERED IN ED: Medications  0.9 %  sodium chloride infusion (0 mL/hr Intravenous Hold 09/06/24 1603)   IMPRESSION / MDM / ASSESSMENT AND PLAN / ED COURSE  I reviewed the triage vital signs and the nursing notes.                             The patient is on the cardiac monitor to evaluate for evidence of arrhythmia and/or significant heart rate changes. Patient's presentation is most consistent with acute presentation with potential threat to life  or bodily function. Patient is a 38 year old female with the above-stated past medical history presents at the request of her hematologist for a blood transfusion given hemoglobin of 6.5.  Patient has no active bleeding at this time.  Patient will receive 1 unit packed red blood cells and anticipate discharge unless patient has any reaction symptoms do not improve.  Symptoms improved greatly patient has no complaints at this time.  Patient stable for discharge at this time and outpatient follow-up for workup of her iron deficiency anemia.  Patient expressed understanding and all questions answered prior to discharge.  Patient given strict return precautions  Dispo: Discharge home with PCP follow-up   FINAL CLINICAL IMPRESSION(S) / ED DIAGNOSES   Final diagnoses:  Iron deficiency anemia, unspecified iron deficiency anemia type  Symptomatic anemia   Rx / DC Orders   ED Discharge Orders     None       Note:  This document was prepared using Dragon voice recognition software and may include unintentional dictation errors.   Jahmil Macleod K, MD 09/06/24 2215

## 2024-09-07 LAB — TYPE AND SCREEN
ABO/RH(D): O POS
Antibody Screen: NEGATIVE
Unit division: 0

## 2024-09-07 LAB — BPAM RBC
Blood Product Expiration Date: 202601112359
ISSUE DATE / TIME: 202512111935
Unit Type and Rh: 5100
